# Patient Record
Sex: Male | Born: 1965 | Race: Black or African American | Hispanic: No | Marital: Single | Smoking: Current every day smoker
Health system: Southern US, Community
[De-identification: ages and names within clinical notes are randomized; demographics above are authoritative.]

## PROBLEM LIST (undated history)

## (undated) DIAGNOSIS — I639 Cerebral infarction, unspecified: Secondary | ICD-10-CM

## (undated) DIAGNOSIS — M109 Gout, unspecified: Secondary | ICD-10-CM

## (undated) DIAGNOSIS — I10 Essential (primary) hypertension: Secondary | ICD-10-CM

## (undated) HISTORY — PX: FRACTURE SURGERY: SHX138

---

## 2020-04-23 ENCOUNTER — Encounter (HOSPITAL_BASED_OUTPATIENT_CLINIC_OR_DEPARTMENT_OTHER): Payer: Self-pay | Admitting: *Deleted

## 2020-04-23 ENCOUNTER — Emergency Department (HOSPITAL_BASED_OUTPATIENT_CLINIC_OR_DEPARTMENT_OTHER): Payer: Medicaid Other

## 2020-04-23 ENCOUNTER — Emergency Department (HOSPITAL_BASED_OUTPATIENT_CLINIC_OR_DEPARTMENT_OTHER)
Admission: EM | Admit: 2020-04-23 | Discharge: 2020-04-23 | Disposition: A | Discharge status: Home / Self Care | Payer: Medicaid Other | Attending: Emergency Medicine | Admitting: Emergency Medicine

## 2020-04-23 ENCOUNTER — Other Ambulatory Visit: Payer: Self-pay

## 2020-04-23 DIAGNOSIS — F17210 Nicotine dependence, cigarettes, uncomplicated: Secondary | ICD-10-CM | POA: Diagnosis not present

## 2020-04-23 DIAGNOSIS — R0602 Shortness of breath: Secondary | ICD-10-CM | POA: Diagnosis present

## 2020-04-23 DIAGNOSIS — J441 Chronic obstructive pulmonary disease with (acute) exacerbation: Secondary | ICD-10-CM | POA: Diagnosis not present

## 2020-04-23 DIAGNOSIS — I10 Essential (primary) hypertension: Secondary | ICD-10-CM | POA: Diagnosis not present

## 2020-04-23 DIAGNOSIS — Z79899 Other long term (current) drug therapy: Secondary | ICD-10-CM | POA: Diagnosis not present

## 2020-04-23 DIAGNOSIS — Z20822 Contact with and (suspected) exposure to covid-19: Secondary | ICD-10-CM | POA: Diagnosis not present

## 2020-04-23 DIAGNOSIS — R911 Solitary pulmonary nodule: Secondary | ICD-10-CM | POA: Diagnosis not present

## 2020-04-23 HISTORY — DX: Gout, unspecified: M10.9

## 2020-04-23 HISTORY — DX: Essential (primary) hypertension: I10

## 2020-04-23 LAB — COMPREHENSIVE METABOLIC PANEL
ALT: 18 U/L (ref 0–44)
AST: 20 U/L (ref 15–41)
Albumin: 4.4 g/dL (ref 3.5–5.0)
Alkaline Phosphatase: 80 U/L (ref 38–126)
Anion gap: 14 (ref 5–15)
BUN: 16 mg/dL (ref 6–20)
CO2: 24 mmol/L (ref 22–32)
Calcium: 9.2 mg/dL (ref 8.9–10.3)
Chloride: 102 mmol/L (ref 98–111)
Creatinine, Ser: 1.19 mg/dL (ref 0.61–1.24)
GFR calc Af Amer: >60 mL/min (ref >60)
GFR calc non Af Amer: >60 mL/min (ref >60)
Glucose, Bld: 116 mg/dL — ABNORMAL HIGH (ref 70–99)
Potassium: 3.7 mmol/L (ref 3.5–5.1)
Sodium: 140 mmol/L (ref 135–145)
Total Bilirubin: 0.8 mg/dL (ref 0.3–1.2)
Total Protein: 8.2 g/dL — ABNORMAL HIGH (ref 6.5–8.1)

## 2020-04-23 LAB — CBC WITH DIFFERENTIAL/PLATELET
Abs Immature Granulocytes: 0.07 10*3/uL (ref 0.00–0.07)
Basophils Absolute: 0.1 10*3/uL (ref 0.0–0.1)
Basophils Relative: 0 %
Eosinophils Absolute: 0.1 10*3/uL (ref 0.0–0.5)
Eosinophils Relative: 0 %
HCT: 38.6 % — ABNORMAL LOW (ref 39.0–52.0)
Hemoglobin: 12.3 g/dL — ABNORMAL LOW (ref 13.0–17.0)
Immature Granulocytes: 1 %
Lymphocytes Relative: 10 %
Lymphs Abs: 1.6 10*3/uL (ref 0.7–4.0)
MCH: 24.7 pg — ABNORMAL LOW (ref 26.0–34.0)
MCHC: 31.9 g/dL (ref 30.0–36.0)
MCV: 77.5 fL — ABNORMAL LOW (ref 80.0–100.0)
Monocytes Absolute: 1.1 10*3/uL — ABNORMAL HIGH (ref 0.1–1.0)
Monocytes Relative: 7 %
Neutro Abs: 12.3 10*3/uL — ABNORMAL HIGH (ref 1.7–7.7)
Neutrophils Relative %: 82 %
Platelets: 418 10*3/uL — ABNORMAL HIGH (ref 150–400)
RBC: 4.98 MIL/uL (ref 4.22–5.81)
RDW: 16.9 % — ABNORMAL HIGH (ref 11.5–15.5)
WBC: 15.1 10*3/uL — ABNORMAL HIGH (ref 4.0–10.5)
nRBC: 0 % (ref 0.0–0.2)

## 2020-04-23 LAB — SARS CORONAVIRUS 2 BY RT PCR (HOSPITAL ORDER, PERFORMED IN ~~LOC~~ HOSPITAL LAB): SARS Coronavirus 2: NEGATIVE

## 2020-04-23 LAB — TROPONIN I (HIGH SENSITIVITY): Troponin I (High Sensitivity): 4 ng/L (ref <18)

## 2020-04-23 LAB — BRAIN NATRIURETIC PEPTIDE: B Natriuretic Peptide: 14.3 pg/mL (ref 0.0–100.0)

## 2020-04-23 MED ORDER — IPRATROPIUM-ALBUTEROL 0.5-2.5 (3) MG/3ML IN SOLN
3.0000 mL | Freq: Once | RESPIRATORY_TRACT | Status: AC
  Administered 2020-04-23: 3 mL via RESPIRATORY_TRACT
  Filled 2020-04-23: qty 3

## 2020-04-23 MED ORDER — METHYLPREDNISOLONE SODIUM SUCC 125 MG IJ SOLR
125.0000 mg | Freq: Once | INTRAMUSCULAR | Status: AC
  Administered 2020-04-23: 125 mg via INTRAVENOUS
  Filled 2020-04-23: qty 2

## 2020-04-23 MED ORDER — IPRATROPIUM BROMIDE HFA 17 MCG/ACT IN AERS
2.0000 | INHALATION_SPRAY | Freq: Once | RESPIRATORY_TRACT | Status: AC
  Administered 2020-04-23: 2 via RESPIRATORY_TRACT

## 2020-04-23 MED ORDER — ALBUTEROL SULFATE HFA 108 (90 BASE) MCG/ACT IN AERS
8.0000 | INHALATION_SPRAY | Freq: Once | RESPIRATORY_TRACT | Status: AC
  Administered 2020-04-23: 8 via RESPIRATORY_TRACT

## 2020-04-23 MED ORDER — ALBUTEROL SULFATE HFA 108 (90 BASE) MCG/ACT IN AERS
INHALATION_SPRAY | RESPIRATORY_TRACT | Status: AC
  Filled 2020-04-23: qty 6.7

## 2020-04-23 MED ORDER — ALBUTEROL SULFATE (2.5 MG/3ML) 0.083% IN NEBU
5.0000 mg | INHALATION_SOLUTION | Freq: Once | RESPIRATORY_TRACT | Status: AC
  Administered 2020-04-23: 5 mg via RESPIRATORY_TRACT
  Filled 2020-04-23: qty 6

## 2020-04-23 MED ORDER — IOHEXOL 350 MG/ML SOLN
100.0000 mL | Freq: Once | INTRAVENOUS | Status: AC | PRN
  Administered 2020-04-23: 100 mL via INTRAVENOUS

## 2020-04-23 MED ORDER — POTASSIUM CHLORIDE CRYS ER 20 MEQ PO TBCR
40.0000 meq | EXTENDED_RELEASE_TABLET | Freq: Once | ORAL | Status: AC
  Administered 2020-04-23: 40 meq via ORAL
  Filled 2020-04-23: qty 2

## 2020-04-23 MED ORDER — IPRATROPIUM BROMIDE HFA 17 MCG/ACT IN AERS
INHALATION_SPRAY | RESPIRATORY_TRACT | Status: AC
  Filled 2020-04-23: qty 12.9

## 2020-04-23 MED ORDER — PREDNISONE 20 MG PO TABS: 40.0000 mg | ORAL_TABLET | Freq: Every day | ORAL | 0 refills | Status: AC

## 2020-04-23 NOTE — Discharge Instructions (Signed)
YOU HAVE A NODULE ON YOUR LUNG. THIS DOES NOT NEED ANYTHING TODAY BUT YOUR PRIMARY CARE DOCTOR NEEDS TO DO ANOTHER CT SCAN OF YOUR CHEST IN 6-12 MONTHS.

## 2020-04-23 NOTE — ED Notes (Signed)
Patient stated that his right leg stays swollen since he surgery in 2014.  Denies pain.

## 2020-04-23 NOTE — ED Notes (Signed)
O2 started upon patient's arrival.  O2 sats between 88-90%, laboredand irregular.

## 2020-04-23 NOTE — ED Notes (Signed)
Xray placed 2 necklaces in urine cup prior to xray. Patient  aware.

## 2020-04-23 NOTE — ED Triage Notes (Signed)
Shortness of breath for a week.  Has been taking Mucinex without relief.

## 2020-04-23 NOTE — ED Notes (Signed)
ED Provider at bedside. 

## 2020-04-23 NOTE — ED Provider Notes (Addendum)
[facility] EMERGENCY DEPARTMENT Provider Note   CSN: [id number] Arrival date & time: 04/23/20  2902     History Chief Complaint  Patient presents with  . Shortness of Breath    Joshua Bates is a 54 y.o. male.  54 year old male with past medical history below including COPD, hypertension, gout, polysubstance abuse who presents with shortness of breath.  Patient states that he has had 1 week of progressively worsening shortness of breath that is nonexertional, associated with a cough.  He has been taking Mucinex without relief.  He reports associated left-sided chest tightness.  He reports lower extremity edema related to gout.  No fevers, vomiting, diarrhea, sore throat, runny nose/congestion, loss of taste or smell, or sick contacts.  He smokes 1 pack/day.  He does not use inhalers at home.  He was unaware of a diagnosis of COPD although it is listed on his previous PCP visits.  The history is provided by the patient.  Shortness of Breath      Past Medical History:  Diagnosis Date  . Gout   . Hypertension   - alcohol use - COPD - chronic pain - cocaine use  There are no problems to display for this patient.   Past Surgical History:  Procedure Laterality Date  . FRACTURE SURGERY         History reviewed. No pertinent family history.  Social History   Tobacco Use  . Smoking status: Current Every Day Smoker    Packs/day: 1.00    Types: Cigarettes  . Smokeless tobacco: Never Used  Substance Use Topics  . Alcohol use: Yes    Comment: drinks liquor and beer everyday  . Drug use: Yes    Types: Marijuana    Comment: yesterday    Home Medications Prior to Admission medications   Medication Sig Start Date End Date Taking? Authorizing Provider  amLODipine (NORVASC) 10 MG tablet Take by mouth.   Yes [provider]  lisinopril-hydrochlorothiazide (ZESTORETIC) 20-25 MG tablet Take by mouth.   Yes [provider]  gabapentin (NEURONTIN)  300 MG capsule Take 300 mg by mouth 3 (three) times daily. 11/14/19   [provider]  predniSONE (DELTASONE) 20 MG tablet Take 2 tablets (40 mg total) by mouth daily. 04/23/20   Dalinda Heidt, Ambrose Finland, MD    Allergies    Patient has no known allergies.  Review of Systems   Review of Systems  Respiratory: Positive for shortness of breath.    All other systems reviewed and are negative except that which was mentioned in HPI  Physical Exam Updated Vital Signs Ht 5\' 7"  (1.702 m)   Wt 90.7 kg   SpO2 96%   BMI 31.32 kg/m   Physical Exam Vitals and nursing note reviewed.  Constitutional:      General: He is in acute distress.     Appearance: He is well-developed. He is ill-appearing. He is not toxic-appearing.     Comments: In mild respiratory distress, sitting up over bed, dyspneic  HENT:     Head: Normocephalic and atraumatic.  Eyes:     Conjunctiva/sclera: Conjunctivae normal.     Pupils: Pupils are equal, round, and reactive to light.  Cardiovascular:     Rate and Rhythm: Regular rhythm. Tachycardia present.     Heart sounds: Normal heart sounds. No murmur.  Pulmonary:     Effort: Tachypnea, accessory muscle usage and respiratory distress present.     Breath sounds: Decreased breath sounds present.  Comments: Increased WOB with severely diminished BS b/l, occasional expiratory wheeze Abdominal:     General: Bowel sounds are normal. There is no distension.     Palpations: Abdomen is soft.     Tenderness: There is no abdominal tenderness.  Musculoskeletal:     Cervical back: Neck supple.     Comments: No significant edema BLE  Skin:    General: Skin is warm and dry.  Neurological:     Mental Status: He is alert and oriented to person, place, and time.     Comments: Fluent speech  Psychiatric:        Mood and Affect: Mood is anxious.        Judgment: Judgment normal.     ED Results / Procedures / Treatments   Labs (all labs ordered are listed, but only  abnormal results are displayed) Labs Reviewed  COMPREHENSIVE METABOLIC PANEL - Abnormal; Notable for the following components:      Result Value   Glucose, Bld 116 (*)    Total Protein 8.2 (*)    All other components within normal limits  CBC WITH DIFFERENTIAL/PLATELET - Abnormal; Notable for the following components:   WBC 15.1 (*)    Hemoglobin 12.3 (*)    HCT 38.6 (*)    MCV 77.5 (*)    MCH 24.7 (*)    RDW 16.9 (*)    Platelets 418 (*)    Neutro Abs 12.3 (*)    Monocytes Absolute 1.1 (*)    All other components within normal limits  SARS CORONAVIRUS 2 BY RT PCR (HOSPITAL ORDER, [place] [facility])  BRAIN NATRIURETIC PEPTIDE  TROPONIN I (HIGH SENSITIVITY)    EKG EKG Interpretation  Date/Time:  Tuesday Apr 23 2020 08:34:51 EDT Ventricular Rate:  116 PR Interval:    QRS Duration: 88 QT Interval:  326 QTC Calculation: 453 R Axis:   69 Text Interpretation: Sinus tachycardia No previous ECGs available Confirmed by Theotis Burrow [phone]) on 04/23/2020 8:38:29 AM   Radiology CT Angio Chest PE W/Cm &/Or Wo Cm  Result Date: 04/23/2020 CLINICAL DATA:  Shortness of breath. EXAM: CT ANGIOGRAPHY CHEST WITH CONTRAST TECHNIQUE: Multidetector CT imaging of the chest was performed using the standard protocol during bolus administration of intravenous contrast. Multiplanar CT image reconstructions and MIPs were obtained to evaluate the vascular anatomy. CONTRAST:  12mL OMNIPAQUE IOHEXOL 350 MG/ML SOLN COMPARISON:  None. FINDINGS: Cardiovascular: Satisfactory opacification of the pulmonary arteries to the segmental level. No evidence of pulmonary embolism. Normal heart size. No pericardial effusion. Mediastinum/Nodes: Thyroid gland and esophagus are unremarkable. 1.3 cm precarinal lymph node is noted which most likely is reactive in etiology. Lungs/Pleura: No pneumothorax or pleural effusion is noted. 7 mm nodule is noted in superior segment of left lower lobe best seen on  image number 43 of series 6. Right lung is clear. Upper Abdomen: No acute abnormality. Musculoskeletal: No chest wall abnormality. No acute or significant osseous findings. Review of the MIP images confirms the above findings. IMPRESSION: 1. No definite evidence of pulmonary embolus. 2. 7 mm left lower lobe nodule is noted. Non-contrast chest CT at 6-12 months is recommended. If the nodule is stable at time of repeat CT, then future CT at 18-24 months (from today's scan) is considered optional for low-risk patients, but is recommended for high-risk patients. This recommendation follows the consensus statement: Guidelines for Management of Incidental Pulmonary Nodules Detected on CT Images: From the Fleischner Society 2017; Radiology 2017; 284:228-243. Electronically  Signed   By: Lupita Raider M.D.   On: 04/23/2020 11:37   DG Chest Port 1 View  Result Date: 04/23/2020 CLINICAL DATA:  Worsening shortness of breath for 1 week EXAM: PORTABLE CHEST 1 VIEW COMPARISON:  None. FINDINGS: Normal heart size and mediastinal contours. No acute infiltrate or edema. No effusion or pneumothorax. No acute osseous findings. IMPRESSION: No active disease. Electronically Signed   By: Marnee Spring M.D.   On: 04/23/2020 09:08    Procedures Procedures (including critical care time) CRITICAL CARE Performed by: Ambrose Finland Nastassia Bazaldua   Total critical care time: 30 minutes  Critical care time was exclusive of separately billable procedures and treating other patients.  Critical care was necessary to treat or prevent imminent or life-threatening deterioration.  Critical care was time spent personally by me on the following activities: development of treatment plan with patient and/or surrogate as well as nursing, discussions with consultants, evaluation of patient's response to treatment, examination of patient, obtaining history from patient or surrogate, ordering and performing treatments and interventions, ordering  and review of laboratory studies, ordering and review of radiographic studies, pulse oximetry and re-evaluation of patient's condition.  Medications Ordered in ED Medications  albuterol (VENTOLIN HFA) 108 (90 Base) MCG/ACT inhaler 8 puff (8 puffs Inhalation Given 04/23/20 0834)  ipratropium (ATROVENT HFA) inhaler 2 puff (2 puffs Inhalation Given 04/23/20 0835)  methylPREDNISolone sodium succinate (SOLU-MEDROL) 125 mg/2 mL injection 125 mg (125 mg Intravenous Given 04/23/20 0853)  ipratropium-albuterol (DUONEB) 0.5-2.5 (3) MG/3ML nebulizer solution 3 mL (3 mLs Nebulization Given 04/23/20 0949)  iohexol (OMNIPAQUE) 350 MG/ML injection 100 mL (100 mLs Intravenous Contrast Given 04/23/20 1050)  albuterol (PROVENTIL) (2.5 MG/3ML) 0.083% nebulizer solution 5 mg (5 mg Nebulization Given 04/23/20 1203)  potassium chloride SA (KLOR-CON) CR tablet 40 mEq (40 mEq Oral Given 04/23/20 1252)    ED Course  I have reviewed the triage vital signs and the nursing notes.  Pertinent labs & imaging results that were available during my care of the patient were reviewed by me and considered in my medical decision making (see chart for details).    MDM Rules/Calculators/A&P                      In mild distress on exam, placed on supplemental O2 via [place], HR 115, severely diminished BS b/l. DDX includes COPD exacerbation, COVID-19, CHF although no hx heart failure, CAP, PE.  Gave several rounds of albuterol and Atrovent as well as Solu-Medrol.  Lab work shows reassuring CMP, WBC 15.1, COVID-19 negative.  Normal BNP and troponin and symptoms are not highly suggestive of ACS or heart failure.  Chest x-ray negative acute.  On several reassessments, patient states that his breathing is much better after albuterol, he does have noticeably improved work of breathing however he has remained hypoxic 88 to 92% on room air.  It is possible that this is due to shunting however given the persistence of his hypoxia, obtain CTA of chest  to evaluate for PE or occult pneumonia.  CTA negative for PE or pneumonia.  Incidental finding of pulmonary nodule with recommendation for repeat CT in 6 to 12 months.  I discussed this with the patient and emphasized the importance of PCP follow-up to have repeat imaging.  After receiving multiple rounds of albuterol in the ED, the patient's pulse ox slowly returned to normal.  On final reassessment, he is 96 to 97% on room air with normal work of breathing.  He states that he is much improved from arrival.  He has follow-up appointment with his PCP next week.  Discussed continuing albuterol on a schedule at home as well as steroid course.  Given clear CT, I do not feel he needs antibiotics at this time.  I have extensively reviewed return precautions and he voiced understanding. Final Clinical Impression(s) / ED Diagnoses Final diagnoses:  COPD exacerbation (HCC)    Rx / DC Orders ED Discharge Orders         Ordered    predniSONE (DELTASONE) 20 MG tablet  Daily     04/23/20 1253           Lurine Imel, Ambrose Finland, MD 04/23/20 1303    Shiela Bruns, Ambrose Finland, MD 04/23/20 1311

## 2022-02-01 ENCOUNTER — Other Ambulatory Visit: Payer: Self-pay

## 2022-02-01 ENCOUNTER — Encounter (HOSPITAL_BASED_OUTPATIENT_CLINIC_OR_DEPARTMENT_OTHER): Payer: Self-pay | Admitting: Urology

## 2022-02-01 ENCOUNTER — Emergency Department (HOSPITAL_BASED_OUTPATIENT_CLINIC_OR_DEPARTMENT_OTHER)
Admission: EM | Admit: 2022-02-01 | Discharge: 2022-02-01 | Disposition: A | Discharge status: Home / Self Care | Payer: Medicaid Other | Attending: Emergency Medicine | Admitting: Emergency Medicine

## 2022-02-01 ENCOUNTER — Emergency Department (HOSPITAL_BASED_OUTPATIENT_CLINIC_OR_DEPARTMENT_OTHER): Payer: Medicaid Other

## 2022-02-01 DIAGNOSIS — I10 Essential (primary) hypertension: Secondary | ICD-10-CM | POA: Insufficient documentation

## 2022-02-01 DIAGNOSIS — W1839XA Other fall on same level, initial encounter: Secondary | ICD-10-CM | POA: Diagnosis not present

## 2022-02-01 DIAGNOSIS — R0781 Pleurodynia: Secondary | ICD-10-CM | POA: Diagnosis not present

## 2022-02-01 DIAGNOSIS — W19XXXA Unspecified fall, initial encounter: Secondary | ICD-10-CM

## 2022-02-01 DIAGNOSIS — M25561 Pain in right knee: Secondary | ICD-10-CM | POA: Insufficient documentation

## 2022-02-01 DIAGNOSIS — Z79899 Other long term (current) drug therapy: Secondary | ICD-10-CM | POA: Diagnosis not present

## 2022-02-01 MED ORDER — AMLODIPINE BESYLATE 10 MG PO TABS
10.0000 mg | ORAL_TABLET | Freq: Every day | ORAL | 0 refills | Status: AC
Start: 2022-02-01 — End: ?

## 2022-02-01 MED ORDER — LISINOPRIL-HYDROCHLOROTHIAZIDE 20-25 MG PO TABS: 1.0000 | ORAL_TABLET | Freq: Every day | ORAL | 0 refills | Status: AC

## 2022-02-01 MED ORDER — METHOCARBAMOL 500 MG PO TABS: 500.0000 mg | ORAL_TABLET | Freq: Two times a day (BID) | ORAL | 0 refills | Status: AC

## 2022-02-01 NOTE — ED Triage Notes (Signed)
Pt reports fall x 3 days ago States knees went out C/0 pain to right side ribs, right knee States hit forehead with fall by no LOC No obvious injury noted  A&O x 4  H/o HTN, states ran out of medications No pcp at this time

## 2022-02-01 NOTE — ED Notes (Signed)
Alert and oriented, voices no neurological complaints at this time, despite have HTN and out of his "meds" x 1 week and not taking them. Provided AVS to pt and informed of the Rx sent for his BP, stated he will take his medication as soon as he gets his medication from the pharmacy, reviewed signs and symptoms that would indicate the need for him to return to the ED due to having symptoms related to his BP. Also provided pt education in regards to coughing and deep breathing exercises due to his presentation complaints today. Opportunity for questions provided prior to DC to home

## 2022-02-01 NOTE — ED Provider Notes (Signed)
[facility] EMERGENCY DEPARTMENT Provider Note   CSN: [id number] Arrival date & time: 02/01/22  1502     History  Chief Complaint  Patient presents with   Joshua Bates is a 56 y.o. male with past medical history significant for hypertension who presents with concern for right rib pain, right knee pain after fall 3 days ago.  Patient reports that he was moving furniture, felt like his right leg went out on him.  He has a previous right leg injury secondary to a wrestling injury.  He reports that he is having some difficulty breathing secondary to his right rib pain.  Patient does endorse hitting his head, but denies loss of consciousness.  He is not taking any blood thinners at this time.  Additionally patient reports that he has 2 hypertension medications that he has not been taking because he has not followed up with his PCP and wonders whether we can refill his blood pressure medications at this time.  Patient denies any chest pain, headache, decreased urine output.   Fall      Home Medications Prior to Admission medications   Medication Sig Start Date End Date Taking? Authorizing Provider  methocarbamol (ROBAXIN) 500 MG tablet Take 1 tablet (500 mg total) by mouth 2 (two) times daily. 02/01/22  Yes Jozsef Wescoat H, PA-C  amLODipine (NORVASC) 10 MG tablet Take 1 tablet (10 mg total) by mouth daily. 02/01/22   Evaleen Sant H, PA-C  gabapentin (NEURONTIN) 300 MG capsule Take 300 mg by mouth 3 (three) times daily. 11/14/19   [provider]  lisinopril-hydrochlorothiazide (ZESTORETIC) 20-25 MG tablet Take 1 tablet by mouth daily. 02/01/22   Shirell Struthers H, PA-C  predniSONE (DELTASONE) 20 MG tablet Take 2 tablets (40 mg total) by mouth daily. 04/23/20   Little, Ambrose Finland, MD      Allergies    Patient has no known allergies.    Review of Systems   Review of Systems  Musculoskeletal:  Positive for arthralgias and myalgias.  All other  systems reviewed and are negative.  Physical Exam Updated Vital Signs BP (!) 187/119 (BP Location: Left Arm)    Pulse (!) 106    Temp 98 F (36.7 C) (Oral)    Resp 18    Ht 5\' 7"  (1.702 m)    Wt 90.7 kg    SpO2 100%    BMI 31.32 kg/m  Physical Exam Vitals and nursing note reviewed.  Constitutional:      General: He is not in acute distress.    Appearance: Normal appearance.  HENT:     Head: Normocephalic and atraumatic.  Eyes:     General:        Right eye: No discharge.        Left eye: No discharge.  Cardiovascular:     Rate and Rhythm: Normal rate and regular rhythm.     Pulses: Normal pulses.     Heart sounds: No murmur heard.   No friction rub. No gallop.     Comments: DP, PT pulses intact bilaterally, radial and ulnar pulses intact bilaterally Pulmonary:     Effort: Pulmonary effort is normal.     Breath sounds: Normal breath sounds.     Comments: Some pain with deep breathing but good breath sounds bilaterally, good excursion bilaterally. Abdominal:     General: Bowel sounds are normal.     Palpations: Abdomen is soft.  Musculoskeletal:     Comments: Some  tenderness to palpation of the right knee with negative Lachman, negative posterior drawer, negative McMurray.  No balloon or ballottement noted.  No varus or valgus laxity.  He has intact range of motion both passively and actively with some pain.  Tenderness to palpation right ribs with no step-off or deformity.  No tenderness to palpation throughout the spine, no step-off or deformity noted.  Patient has intact strength 5 out of 5 bilateral upper and lower extremities.  Skin:    General: Skin is warm and dry.     Capillary Refill: Capillary refill takes less than 2 seconds.  Neurological:     Mental Status: He is alert and oriented to person, place, and time.  Psychiatric:        Mood and Affect: Mood normal.        Behavior: Behavior normal.    ED Results / Procedures / Treatments   Labs (all labs ordered  are listed, but only abnormal results are displayed) Labs Reviewed - No data to display  EKG None  Radiology DG Ribs Unilateral W/Chest Right  Result Date: 02/01/2022 CLINICAL DATA:  Chest pain, fall EXAM: RIGHT RIBS AND CHEST - 3+ VIEW COMPARISON:  Radiograph 04/23/2020, chest CT 04/23/2020 FINDINGS: The heart size and mediastinal contours are within normal limits.No focal airspace disease. No pleural effusion or pneumothorax.No acute osseous abnormality. Specifically, there is no evidence of displaced rib fracture. IMPRESSION: No evidence of displaced rib fracture. No acute cardiopulmonary disease. Electronically Signed   By: Caprice Renshaw M.D.   On: 02/01/2022 16:00   DG Knee Complete 4 Views Right  Result Date: 02/01/2022 CLINICAL DATA:  Fall, right knee pain EXAM: RIGHT KNEE - COMPLETE 4+ VIEW COMPARISON:  No prior images retrievable at the time of this dictation. FINDINGS: Prior plate fixation of the proximal tibia. Old trauma appears well-healed. There is no evidence of acute fracture. 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 There is no significant joint effusion. Vascular calcifications. There is prepatellar soft tissue swelling. IMPRESSION: Prepatellar soft tissue swelling.  No evidence of acute fracture. Prior plate fixation of the proximal tibia without evidence of hardware complication. Electronically Signed   By: Caprice Renshaw M.D.   On: 02/01/2022 16:02    Procedures Procedures    Medications Ordered in ED Medications - No data to display  ED Course/ Medical Decision Making/ A&P                           Medical Decision Making Amount and/or Complexity of Data Reviewed Radiology: ordered.  Risk Prescription drug management.   This an overall well-appearing 56 year old male who presents with concern for persistent right rib injury, right knee injury after a fall 3 days ago.  Additionally he arrives hypertensive with systolic  of 187 and requests refill of his blood pressure medications for known hypertension.  Patient reports he does have a PCP but has not seen her recently.  My emergent differential diagnosis includes acute fracture, dislocation of the right knee, acute rib fracture, flail chest.  Additionally with patient's elevated blood pressure worry about endorgan damage, chest pain, headache.  Patient denies the symptoms at this time.  Obtain radiographic imaging of the right knee, right unilateral ribs.  I personally interpreted these findings, I see no evidence of acute fracture or dislocation.  I agree with the radiologist interpretation.  We will refill patient's blood pressure medication encourage close follow-up with his PCP,  discussed return precautions for headache, chest pain, decreased urine output, or other abnormalities.  He is neurovascularly intact with rate leg pain, right rib pain.  He does have adequate lung excursion with some difficulty.  Encouraged ibuprofen, Tylenol, and will trial Robaxin.  Encourage follow-up with orthopedics if pain does not improve despite treatment. Final Clinical Impression(s) / ED Diagnoses Final diagnoses:  Fall, initial encounter  Acute pain of right knee  Rib pain on right side    Rx / DC Orders ED Discharge Orders          Ordered    amLODipine (NORVASC) 10 MG tablet  Daily        02/01/22 1707    lisinopril-hydrochlorothiazide (ZESTORETIC) 20-25 MG tablet  Daily        02/01/22 1707    methocarbamol (ROBAXIN) 500 MG tablet  2 times daily        02/01/22 1707              Glorian Mcdonell, Vinton H, PA-C 02/01/22 1727    Charlynne Pander, MD 02/02/22 [phone]

## 2022-02-01 NOTE — Discharge Instructions (Addendum)
Please use Tylenol or ibuprofen for pain.  You may use 600 mg ibuprofen every 6 hours or 1000 mg of Tylenol every 6 hours.  You may choose to alternate between the 2.  This would be most effective.  Not to exceed 4 g of Tylenol within 24 hours.  Not to exceed 3200 mg ibuprofen 24 hours.  You can use the muscle relaxant in addition to the above for breakthrough pain.  Please start taking your blood pressure medication every day again.  I recommend that you follow-up with your primary care doctor within a month to get back on a regular prescription.  If your knee pain, rib pain fails to improve despite treatment I recommend that you follow-up with orthopedics for further evaluation.

## 2023-01-02 IMAGING — DX DG KNEE COMPLETE 4+V*R*
4 series · 4 of 4 positions shown · non-contrast
Comparison: No prior images retrievable at the time of this
dictation.

CLINICAL DATA: Fall, right knee pain

EXAM:
RIGHT KNEE - COMPLETE 4+ VIEW

[knee ap]
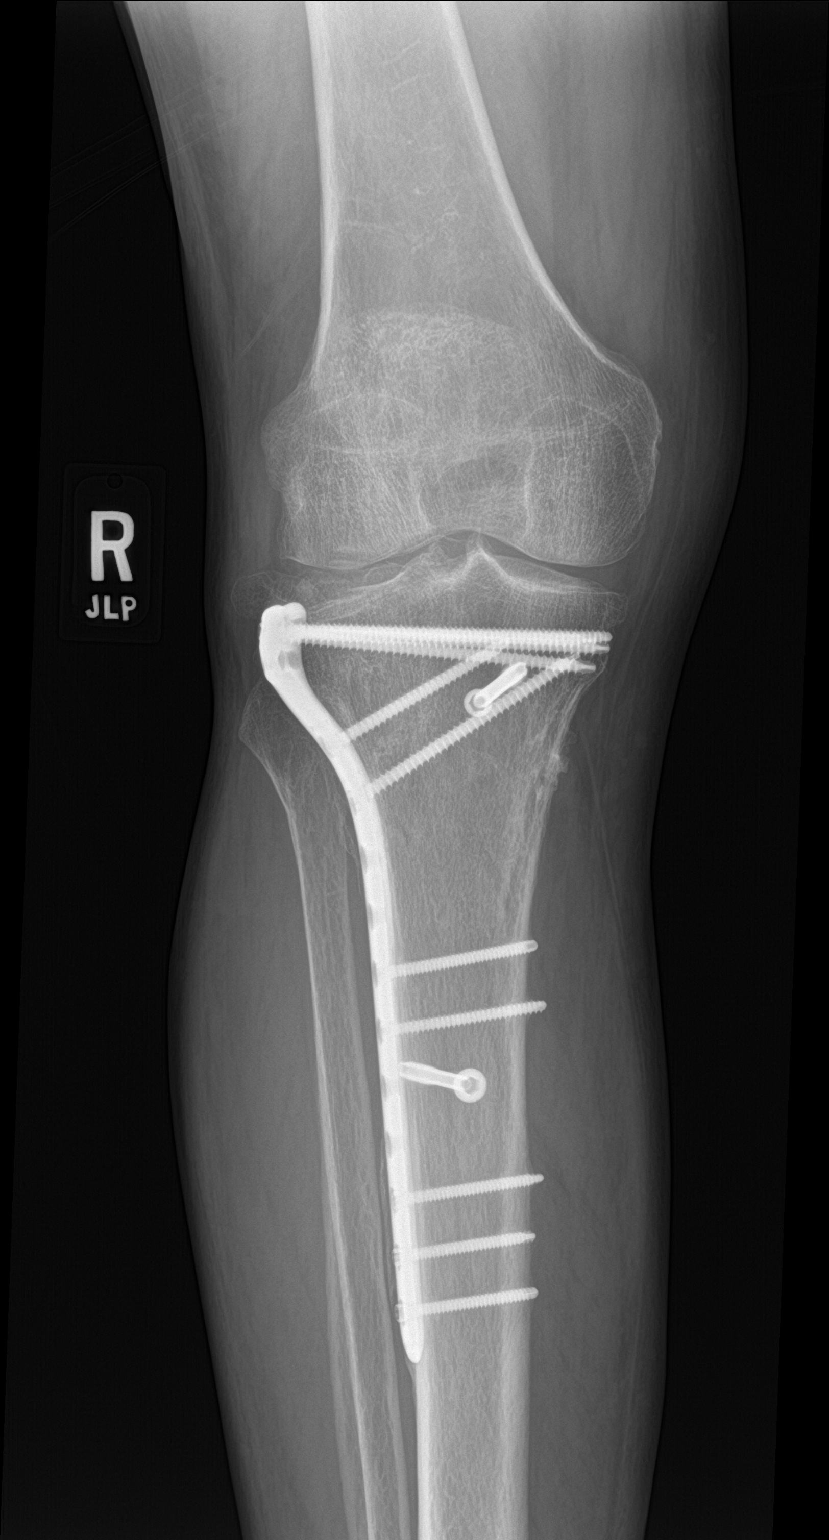

[knee lat]
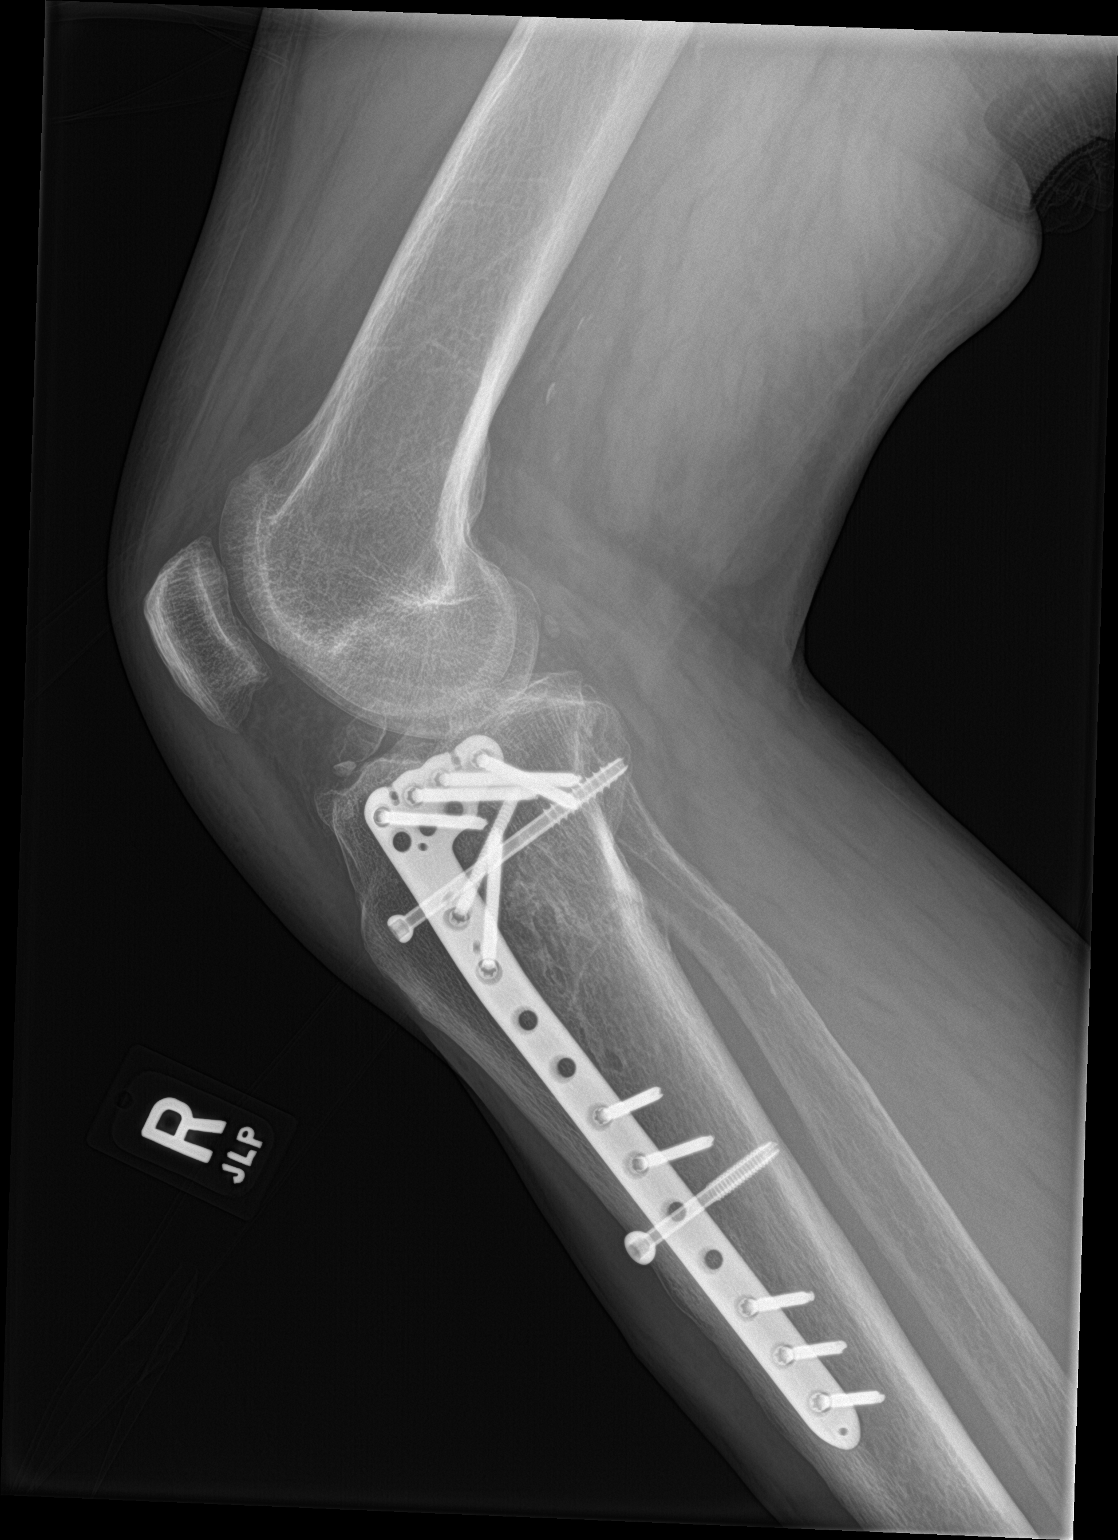

[knee obl (1 of 2)]
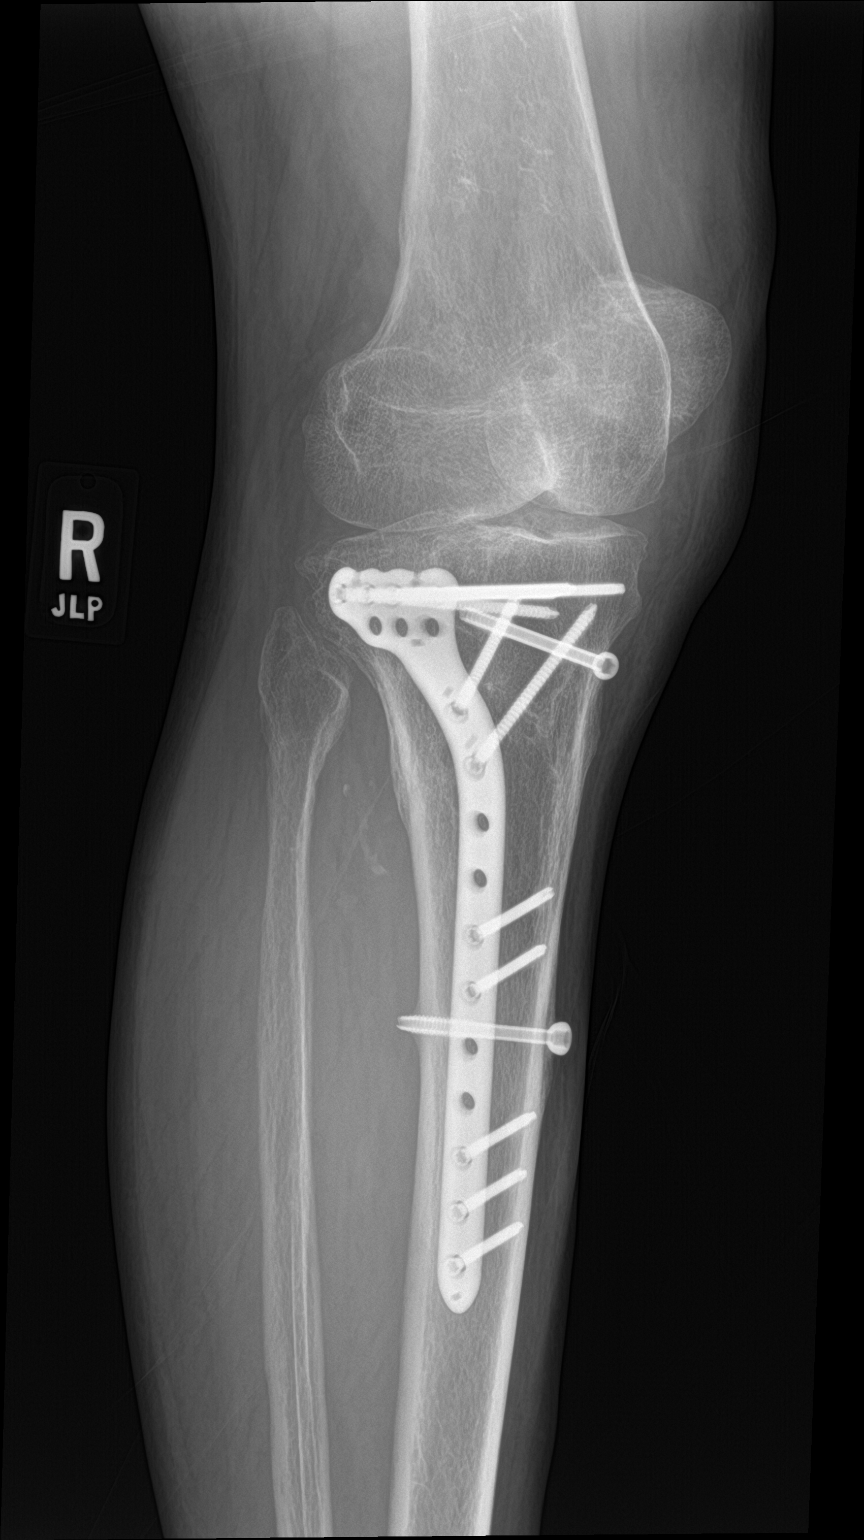

[knee obl (2 of 2)]
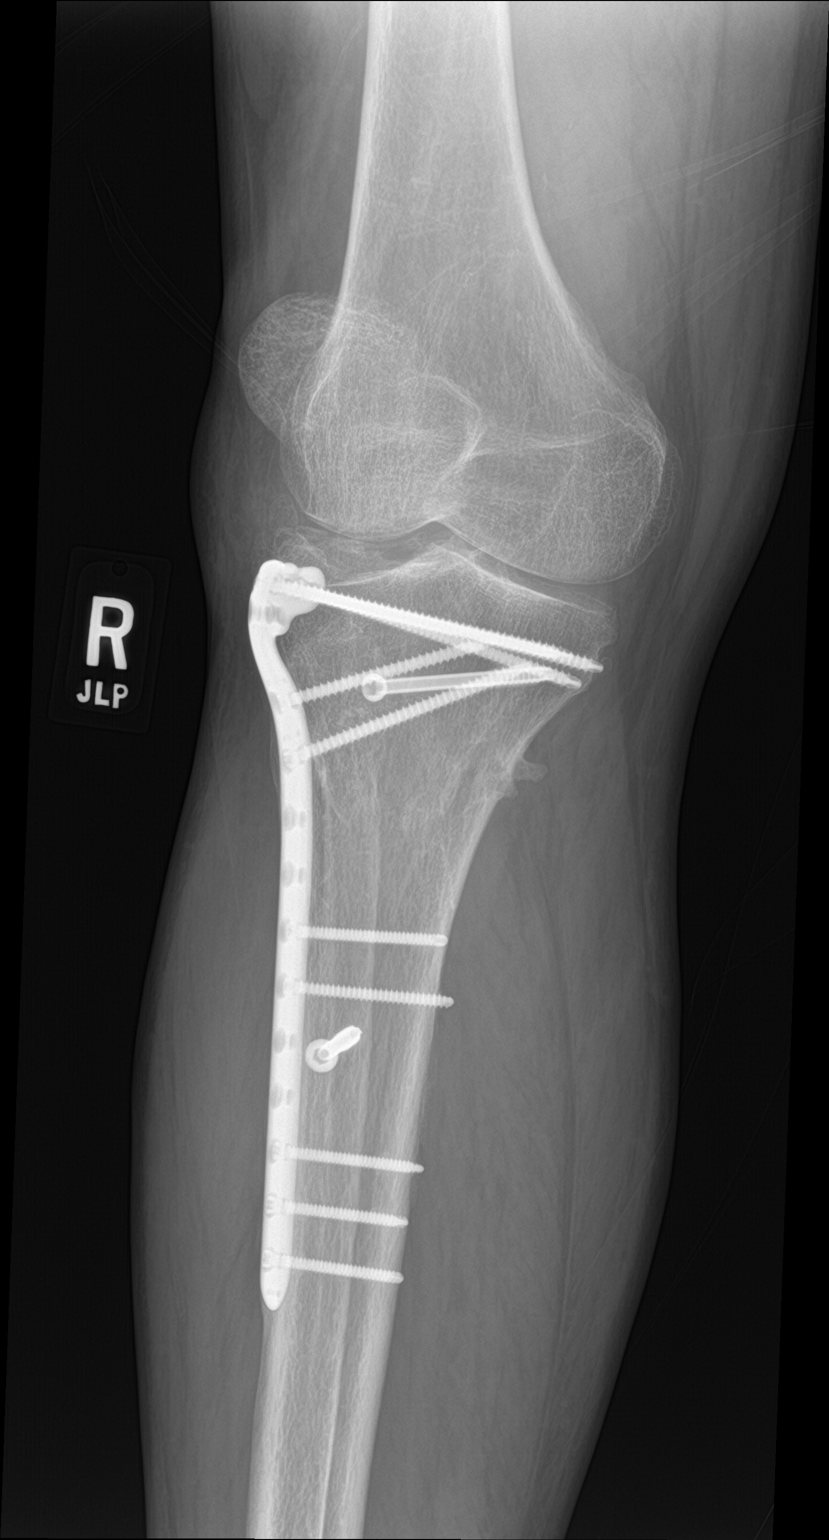

[4 of 4 positions shown; findings below may reference images not displayed]

FINDINGS: Prior plate fixation of the proximal tibia. Old trauma appears
well-healed. There is no evidence of acute fracture. There is
posttraumatic osteoarthritis with bulky lateral compartment
osteophyte formation. There is no significant joint effusion.
Vascular calcifications. There is prepatellar soft tissue swelling.
IMPRESSION: Prepatellar soft tissue swelling.  No evidence of acute fracture.

Prior plate fixation of the proximal tibia without evidence of
hardware complication.

## 2023-10-30 ENCOUNTER — Other Ambulatory Visit: Payer: Self-pay

## 2023-10-30 ENCOUNTER — Emergency Department (HOSPITAL_BASED_OUTPATIENT_CLINIC_OR_DEPARTMENT_OTHER): Payer: Medicaid Other

## 2023-10-30 ENCOUNTER — Emergency Department (HOSPITAL_BASED_OUTPATIENT_CLINIC_OR_DEPARTMENT_OTHER)
Admission: EM | Admit: 2023-10-30 | Discharge: 2023-10-30 | Disposition: A | Discharge status: Home / Self Care | Payer: Medicaid Other | Attending: Emergency Medicine | Admitting: Emergency Medicine

## 2023-10-30 ENCOUNTER — Encounter (HOSPITAL_BASED_OUTPATIENT_CLINIC_OR_DEPARTMENT_OTHER): Payer: Self-pay

## 2023-10-30 DIAGNOSIS — R1032 Left lower quadrant pain: Secondary | ICD-10-CM

## 2023-10-30 DIAGNOSIS — F17200 Nicotine dependence, unspecified, uncomplicated: Secondary | ICD-10-CM | POA: Insufficient documentation

## 2023-10-30 DIAGNOSIS — R109 Unspecified abdominal pain: Secondary | ICD-10-CM | POA: Diagnosis present

## 2023-10-30 DIAGNOSIS — Z79899 Other long term (current) drug therapy: Secondary | ICD-10-CM | POA: Insufficient documentation

## 2023-10-30 DIAGNOSIS — I10 Essential (primary) hypertension: Secondary | ICD-10-CM | POA: Insufficient documentation

## 2023-10-30 DIAGNOSIS — R0602 Shortness of breath: Secondary | ICD-10-CM | POA: Diagnosis not present

## 2023-10-30 DIAGNOSIS — Z1152 Encounter for screening for COVID-19: Secondary | ICD-10-CM | POA: Diagnosis not present

## 2023-10-30 DIAGNOSIS — K5792 Diverticulitis of intestine, part unspecified, without perforation or abscess without bleeding: Secondary | ICD-10-CM

## 2023-10-30 HISTORY — DX: Cerebral infarction, unspecified: I63.9

## 2023-10-30 LAB — COMPREHENSIVE METABOLIC PANEL
ALT: 13 U/L (ref 0–44)
AST: 20 U/L (ref 15–41)
Albumin: 4.3 g/dL (ref 3.5–5.0)
Alkaline Phosphatase: 69 U/L (ref 38–126)
Anion gap: 11 (ref 5–15)
BUN: 11 mg/dL (ref 6–20)
CO2: 24 mmol/L (ref 22–32)
Calcium: 8.7 mg/dL — ABNORMAL LOW (ref 8.9–10.3)
Chloride: 100 mmol/L (ref 98–111)
Creatinine, Ser: 1.02 mg/dL (ref 0.61–1.24)
GFR, Estimated: >60 mL/min (ref >60)
Glucose, Bld: 102 mg/dL — ABNORMAL HIGH (ref 70–99)
Potassium: 3.9 mmol/L (ref 3.5–5.1)
Sodium: 135 mmol/L (ref 135–145)
Total Bilirubin: 0.5 mg/dL (ref <1.2)
Total Protein: 7.9 g/dL (ref 6.5–8.1)

## 2023-10-30 LAB — CBC WITH DIFFERENTIAL/PLATELET
Abs Immature Granulocytes: 0.04 10*3/uL (ref 0.00–0.07)
Basophils Absolute: 0.1 10*3/uL (ref 0.0–0.1)
Basophils Relative: 1 %
Eosinophils Absolute: 0.1 10*3/uL (ref 0.0–0.5)
Eosinophils Relative: 1 %
HCT: 50.3 % (ref 39.0–52.0)
Hemoglobin: 16.8 g/dL (ref 13.0–17.0)
Immature Granulocytes: 1 %
Lymphocytes Relative: 25 %
Lymphs Abs: 1.8 10*3/uL (ref 0.7–4.0)
MCH: 28.4 pg (ref 26.0–34.0)
MCHC: 33.4 g/dL (ref 30.0–36.0)
MCV: 85 fL (ref 80.0–100.0)
Monocytes Absolute: 0.6 10*3/uL (ref 0.1–1.0)
Monocytes Relative: 9 %
Neutro Abs: 4.6 10*3/uL (ref 1.7–7.7)
Neutrophils Relative %: 63 %
Platelets: 286 10*3/uL (ref 150–400)
RBC: 5.92 MIL/uL — ABNORMAL HIGH (ref 4.22–5.81)
RDW: 13.8 % (ref 11.5–15.5)
WBC: 7.1 10*3/uL (ref 4.0–10.5)
nRBC: 0 % (ref 0.0–0.2)

## 2023-10-30 LAB — SARS CORONAVIRUS 2 BY RT PCR: SARS Coronavirus 2 by RT PCR: NEGATIVE

## 2023-10-30 LAB — PROTIME-INR
INR: 1 (ref 0.8–1.2)
Prothrombin Time: 13.1 s (ref 11.4–15.2)

## 2023-10-30 LAB — LIPASE, BLOOD: Lipase: 27 U/L (ref 11–51)

## 2023-10-30 LAB — BRAIN NATRIURETIC PEPTIDE: B Natriuretic Peptide: 12.7 pg/mL (ref 0.0–100.0)

## 2023-10-30 MED ORDER — AMOXICILLIN-POT CLAVULANATE 875-125 MG PO TABS: 1.0000 | ORAL_TABLET | Freq: Two times a day (BID) | ORAL | 0 refills | Status: AC

## 2023-10-30 MED ORDER — IOHEXOL 300 MG/ML  SOLN
100.0000 mL | Freq: Once | INTRAMUSCULAR | Status: AC | PRN
  Administered 2023-10-30: 100 mL via INTRAVENOUS

## 2023-10-30 MED ORDER — IPRATROPIUM-ALBUTEROL 0.5-2.5 (3) MG/3ML IN SOLN
3.0000 mL | Freq: Once | RESPIRATORY_TRACT | Status: AC
  Administered 2023-10-30: 3 mL via RESPIRATORY_TRACT
  Filled 2023-10-30: qty 3

## 2023-10-30 MED ORDER — ALBUTEROL SULFATE HFA 108 (90 BASE) MCG/ACT IN AERS: 2.0000 | INHALATION_SPRAY | RESPIRATORY_TRACT | Status: DC | PRN

## 2023-10-30 NOTE — ED Provider Notes (Signed)
I discussed with Dr. Dwain Sarna on-call for general surgery.  He feels that the appendix is patent and he can see air all away down through but there is inflammation around it and some near the sigmoid colon area.  This may be early diverticulitis.  Will treat him with Augmentin for diverticulitis and given precautions to return for any new or worse symptoms.  Patient's liver function test are normal and white count is not elevated.  Again general surgery was fairly confident this is not consistent with appendicitis.   Vanetta Mulders, MD 10/30/23 [phone]

## 2023-10-30 NOTE — ED Notes (Signed)
Patient transported to X-ray 

## 2023-10-30 NOTE — Discharge Instructions (Signed)
Take the antibiotic Augmentin as directed for the next 7 days.  Would expect improvement over the next 2 days.  Return for any new or worse symptoms.  On Monday 7 appoint with your primary care doctor just for follow-up.  Surgeon on-call and the appendix does not appear to be acutely infected.  Would avoid spicy foods.  And would suggest sort of a liquid diet for the next 24 hours.

## 2023-10-30 NOTE — ED Triage Notes (Signed)
The patient has had shortness of breath for three days with a cough. He stated when he coughs he has some abd pain.

## 2023-10-30 NOTE — ED Provider Notes (Signed)
[place] [facility] Provider Note   CSN: [id number] Arrival date & time: 10/30/23  1049     History  Chief Complaint  Patient presents with   Shortness of Breath    Joshua Bates is a 57 y.o. male.  With a history of hypertension, CVA, alcohol use and tobacco use who presents to the ED for abdominal pain.  3 days of coughing, shortness of breath and left-sided abdominal pain.  Cough is been nonproductive.  Patient notes a 30 pound weight gain over the last month.  Drinks alcohol daily.  No chest pain, fevers, chills, changes in bowel habits or urinary symptoms.  No prior history of similar presentations.   Shortness of Breath      Home Medications Prior to Admission medications   Medication Sig Start Date End Date Taking? Authorizing Provider  amLODipine (NORVASC) 10 MG tablet Take 1 tablet (10 mg total) by mouth daily. 02/01/22   Prosperi, Christian H, PA-C  gabapentin (NEURONTIN) 300 MG capsule Take 300 mg by mouth 3 (three) times daily. 11/14/19   [provider]  lisinopril-hydrochlorothiazide (ZESTORETIC) 20-25 MG tablet Take 1 tablet by mouth daily. 02/01/22   Prosperi, Christian H, PA-C  methocarbamol (ROBAXIN) 500 MG tablet Take 1 tablet (500 mg total) by mouth 2 (two) times daily. 02/01/22   Prosperi, Christian H, PA-C  predniSONE (DELTASONE) 20 MG tablet Take 2 tablets (40 mg total) by mouth daily. 04/23/20   Little, Ambrose Finland, MD      Allergies    Iodinated contrast media    Review of Systems   Review of Systems  Respiratory:  Positive for shortness of breath.     Physical Exam Updated Vital Signs BP (!) 197/108   Pulse 73   Temp 98.3 F (36.8 C) (Oral)   Resp 17   Ht 5\' 7"  (1.702 m)   Wt 90 kg   SpO2 98%   BMI 31.08 kg/m  Physical Exam Vitals and nursing note reviewed.  HENT:     Head: Normocephalic and atraumatic.  Eyes:     Pupils: Pupils are equal, round, and reactive to light.  Cardiovascular:      Rate and Rhythm: Normal rate and regular rhythm.  Pulmonary:     Effort: Pulmonary effort is normal.     Breath sounds: Examination of the right-upper field reveals wheezing. Examination of the left-upper field reveals wheezing. Wheezing present.  Abdominal:     Palpations: Abdomen is soft.     Tenderness: There is no abdominal tenderness.     Comments: Protuberant abdomen with no fluid wave Left upper quadrant left lower quadrant tenderness with no rebound rigidity or guarding  Musculoskeletal:     Right lower leg: No edema.  Skin:    General: Skin is warm and dry.  Neurological:     Mental Status: He is alert.  Psychiatric:        Mood and Affect: Mood normal.     ED Results / Procedures / Treatments   Labs (all labs ordered are listed, but only abnormal results are displayed) Labs Reviewed  COMPREHENSIVE METABOLIC PANEL - Abnormal; Notable for the following components:      Result Value   Glucose, Bld 102 (*)    Calcium 8.7 (*)    All other components within normal limits  CBC WITH DIFFERENTIAL/PLATELET - Abnormal; Notable for the following components:   RBC 5.92 (*)    All other components within normal limits  SARS CORONAVIRUS 2 BY RT PCR  LIPASE, BLOOD  BRAIN NATRIURETIC PEPTIDE  PROTIME-INR    EKG EKG Interpretation Date/Time:  Saturday October 30 2023 11:14:19 EST Ventricular Rate:  81 PR Interval:  178 QRS Duration:  81 QT Interval:  387 QTC Calculation: 450 R Axis:   55  Text Interpretation: Sinus rhythm Anteroseptal infarct, old Borderline repolarization abnormality Baseline wander in lead(s) II aVR Confirmed by Estelle June [phone]) on 10/30/2023 3:51:17 PM  Radiology CT ABDOMEN PELVIS W CONTRAST  Result Date: 10/30/2023 CLINICAL DATA:  57 year old male with history of left lower quadrant and left-sided abdominal pain. Shortness of breath for the past 3 days with cough. EXAM: CT ABDOMEN AND PELVIS WITH CONTRAST TECHNIQUE: Multidetector CT imaging  of the abdomen and pelvis was performed using the standard protocol following bolus administration of intravenous contrast. RADIATION DOSE REDUCTION: This exam was performed according to the departmental dose-optimization program which includes automated exposure control, adjustment of the mA and/or kV according to patient size and/or use of iterative reconstruction technique. CONTRAST:  OMNIPAQUE IOHEXOL 300 MG/ML  SOLN COMPARISON:  None Available. FINDINGS: Lower chest: Mild cardiomegaly with concentric left ventricular hypertrophy. Hepatobiliary: No suspicious cystic or solid hepatic lesions. No intra or extrahepatic biliary ductal dilatation. Gallbladder is normal in appearance. Pancreas: No pancreatic mass. No pancreatic ductal dilatation. No pancreatic or peripancreatic fluid collections or inflammatory changes. Spleen: Unremarkable. Adrenals/Urinary Tract: Subcentimeter low-attenuation lesion left kidney, too small to characterize, statistically likely to represent a cyst (no imaging follow-up recommended). Right kidney and bilateral adrenal glands are normal in appearance. No hydroureteronephrosis. Urinary bladder is unremarkable in appearance. Stomach/Bowel: The appearance of the stomach is normal. No pathologic dilatation of small bowel or colon. A few scattered colonic diverticula are noted. Inflammatory changes are noted in the right lower quadrant adjacent to several diverticuli. Notably, the patient's appendix extends into this region, and is also surrounded by these inflammatory changes. Whether this reflects an acute appendicitis or acute diverticulitis is uncertain. Vascular/Lymphatic: Atherosclerotic calcifications are noted in the abdominal aorta and pelvic vasculature, without evidence of aneurysm or dissection. No lymphadenopathy noted in the abdomen or pelvis. Reproductive: Prostate gland and seminal vesicles are unremarkable in appearance. Other: No significant volume of ascites.  No  pneumoperitoneum. Musculoskeletal: There are no aggressive appearing lytic or blastic lesions noted in the visualized portions of the skeleton. IMPRESSION: 1. Inflammatory changes centered in the right lower quadrant. Whether this represents inflammation around inflamed diverticuli in the distal sigmoid colon, or inflammation centered around the appendix (which is otherwise grossly normal in appearance) is uncertain. Overall, this is favored to represent an inflamed appendix concerning for early acute appendicitis. Surgical consultation is recommended for further clinical evaluation. 2. Mild cardiomegaly with concentric left ventricular hypertrophy. Electronically Signed   By: Trudie Reed M.D.   On: 10/30/2023 13:51   DG Chest 2 View  Result Date: 10/30/2023 CLINICAL DATA:  Shortness of breath.  Nonproductive cough for 1 week EXAM: CHEST - 2 VIEW COMPARISON:  02/01/2022 FINDINGS: Artifact from EKG leads. Probable airway thickening. Mild patchy linear opacity in the left lung. No edema, effusion, or pneumothorax. Normal heart size and mediastinal contours. IMPRESSION: Bronchitic markings with mild left-sided atelectasis. Electronically Signed   By: Tiburcio Pea M.D.   On: 10/30/2023 11:59    Procedures Procedures    Medications Ordered in ED Medications  albuterol (VENTOLIN HFA) 108 (90 Base) MCG/ACT inhaler 2 puff (has no administration in time range)  ipratropium-albuterol (DUONEB) 0.5-2.5 (3)  MG/3ML nebulizer solution 3 mL (3 mLs Nebulization Given 10/30/23 1202)  iohexol (OMNIPAQUE) 300 MG/ML solution 100 mL (100 mLs Intravenous Contrast Given 10/30/23 1232)    ED Course/ Medical Decision Making/ A&P Clinical Course as of 10/30/23 1551  Sat Oct 30, 2023  1448 No leukocytosis on exam.  LFTs within normal limits.  No elevation lipase.  Chest x-ray shows atelectasis but no focal consolidation that would be concerning for pneumonia.  CT abdomen pelvis shows inflammatory changes concerning  for acute appendicitis.  Reevaluated patient informed of these findings.  He still has no right lower quadrant tenderness it is all left-sided.  Paged surgery to discuss [MP]  1549 On-call surgeon currently in OR case.  Call taken from OR nurse.  On-call surgeon will review CT once case is completed and call us back.  Jackquline Bosch DO, am transitioning care of this patient to the oncoming provider pending discussion with surgery, reevaluation and disposition [MP]    Clinical Course User Index [MP] Royanne Foots, DO                                 Medical Decision Making 57 year old male with history as above presenting for 3 days of left-sided abdominal pain, coughing shortness of breath.  Notes 30 pound weight gain over the last month.  Afebrile and hypertensive on exam.  Exam notable for protuberant abdomen with bilateral wheezing.  No fluid wave.  Presentation most concerning for potential intra-abdominal infection versus pneumonia.  Will obtain chest x-ray and CT abdomen pelvis.  Left-sided abdominal pain may indicate diverticulitis.  Will obtain laboratory workup including CBC, CMP as well as BNP to look for any evidence of heart failure.  No chest pain.  Low suspicion for ACS at this time.  Considering his history of chronic alcohol use presentation would be worrisome for acute liver failure with subsequent ascites as well  Amount and/or Complexity of Data Reviewed Labs: ordered. Radiology: ordered.  Risk Prescription drug management.           Final Clinical Impression(s) / ED Diagnoses Final diagnoses:  Left lower quadrant abdominal pain  Shortness of breath    Rx / DC Orders ED Discharge Orders     None         Royanne Foots, DO 10/30/23 1551

## 2023-10-30 NOTE — ED Notes (Signed)
Patient ambulated to room with pulse ox. Noted SOB, SAT 93%, HR 102

## 2023-10-30 NOTE — ED Notes (Signed)
Patient transported to CT
